# Patient Record
Sex: Female | Born: 1975 | Race: Black or African American | Hispanic: No | Marital: Single | Smoking: Never smoker
Health system: Southern US, Community
[De-identification: ages and names within clinical notes are randomized; demographics above are authoritative.]

---

## 2019-03-27 ENCOUNTER — Other Ambulatory Visit (HOSPITAL_COMMUNITY): Payer: Self-pay | Admitting: *Deleted

## 2019-03-27 DIAGNOSIS — N6310 Unspecified lump in the right breast, unspecified quadrant: Secondary | ICD-10-CM

## 2019-04-24 ENCOUNTER — Other Ambulatory Visit (HOSPITAL_COMMUNITY): Payer: Self-pay | Admitting: *Deleted

## 2019-04-24 ENCOUNTER — Encounter (HOSPITAL_COMMUNITY): Payer: Self-pay

## 2019-04-24 ENCOUNTER — Other Ambulatory Visit: Payer: Self-pay | Admitting: Obstetrics and Gynecology

## 2019-04-24 ENCOUNTER — Ambulatory Visit (HOSPITAL_COMMUNITY)
Admission: RE | Admit: 2019-04-24 | Discharge: 2019-04-24 | Disposition: A | Discharge status: Home / Self Care | Payer: Self-pay | Source: Ambulatory Visit | Attending: Obstetrics and Gynecology | Admitting: Obstetrics and Gynecology

## 2019-04-24 ENCOUNTER — Ambulatory Visit: Payer: Self-pay

## 2019-04-24 ENCOUNTER — Other Ambulatory Visit: Payer: Self-pay

## 2019-04-24 DIAGNOSIS — Z1231 Encounter for screening mammogram for malignant neoplasm of breast: Secondary | ICD-10-CM

## 2019-04-24 DIAGNOSIS — Z01419 Encounter for gynecological examination (general) (routine) without abnormal findings: Secondary | ICD-10-CM

## 2019-04-24 NOTE — Progress Notes (Signed)
Patient complained of a reddened bump on her right breast that went away around 3 weeks ago.  Pap Smear: Pap smear completed today. Last Pap smear was 04/13/2006 in [place] and normal per patient. Per patient has no history of an abnormal Pap smear. No Pap smear results are in Epic.  Physical exam: Breasts Breasts symmetrical. No skin abnormalities bilateral breasts. No nipple retraction bilateral breasts. No nipple discharge bilateral breasts. No lymphadenopathy. No lumps palpated bilateral breasts. No complaints of pain or tenderness on exam. Referred patient to the [facility] for a screening mammogram. Appointment scheduled for Thursday, April 26, 2019 at 0910.        Pelvic/Bimanual   Ext Genitalia No lesions, no swelling and no discharge observed on external genitalia.         Vagina Vagina pink and normal texture. No lesions or discharge observed in vagina.          Cervix Cervix is present. Cervix pink and of normal texture. White bumps observed on the left upper outer quadrant of cervix. No discharge observed.     Uterus Uterus is present and palpable. Uterus in normal position and normal size.        Adnexae Bilateral ovaries present and palpable. No tenderness on palpation.         Rectovaginal No rectal exam completed today since patient had no rectal complaints. No skin abnormalities observed on exam.    Smoking History: Patient has never smoked.  Patient Navigation: Patient education provided. Access to services provided for patient through [facility] program.   Breast and Cervical Cancer Risk Assessment: Patient has a family history of her mother having breast cancer. Patient has no known genetic mutations or history of radiation treatment to the chest before age 52. Patient has no history of cervical dysplasia, immunocompromised, or DES exposure in-utero.  Risk Assessment    Risk Scores      04/24/2019   Last edited by: Loletta Parish, RN    5-year risk: 1.1 %   Lifetime risk: 11.3 %

## 2019-04-24 NOTE — Patient Instructions (Signed)
Explained breast self awareness with Sanda Klein. Let patient know [facility] will cover Pap smears and HPV typing every 5 years unless has a history of abnormal Pap smears. Referred patient to the [facility] for a screening mammogram. Appointment scheduled for Thursday, April 26, 2019 at 0910. Patient aware of appointment and will be there. Let patient know will follow up with her within the next couple weeks with results of Pap smear by phone. Informed patient that the [facility] will follow-up with her within the next couple weeks with results of mammogram by letter or phone. Sanda Klein verbalized understanding.  Asencion Guisinger, Arvil Chaco, RN 1:38 PM

## 2019-04-26 ENCOUNTER — Other Ambulatory Visit: Payer: Self-pay

## 2019-04-26 ENCOUNTER — Ambulatory Visit
Admission: RE | Admit: 2019-04-26 | Discharge: 2019-04-26 | Disposition: A | Discharge status: Home / Self Care | Payer: Self-pay | Source: Ambulatory Visit | Attending: Obstetrics and Gynecology | Admitting: Obstetrics and Gynecology

## 2019-04-26 DIAGNOSIS — Z1231 Encounter for screening mammogram for malignant neoplasm of breast: Secondary | ICD-10-CM

## 2019-04-27 LAB — CYTOLOGY - PAP
Comment: NEGATIVE
Diagnosis: NEGATIVE
Diagnosis: REACTIVE
High risk HPV: NEGATIVE

## 2019-04-30 ENCOUNTER — Other Ambulatory Visit: Payer: Self-pay | Admitting: Obstetrics and Gynecology

## 2019-04-30 DIAGNOSIS — R928 Other abnormal and inconclusive findings on diagnostic imaging of breast: Secondary | ICD-10-CM

## 2019-05-02 ENCOUNTER — Other Ambulatory Visit: Payer: Self-pay | Admitting: Obstetrics and Gynecology

## 2019-05-02 ENCOUNTER — Other Ambulatory Visit: Payer: Self-pay

## 2019-05-02 ENCOUNTER — Ambulatory Visit
Admission: RE | Admit: 2019-05-02 | Discharge: 2019-05-02 | Disposition: A | Discharge status: Home / Self Care | Payer: No Typology Code available for payment source | Source: Ambulatory Visit | Attending: Obstetrics and Gynecology | Admitting: Obstetrics and Gynecology

## 2019-05-02 DIAGNOSIS — R928 Other abnormal and inconclusive findings on diagnostic imaging of breast: Secondary | ICD-10-CM

## 2019-05-02 DIAGNOSIS — N6310 Unspecified lump in the right breast, unspecified quadrant: Secondary | ICD-10-CM

## 2019-05-09 ENCOUNTER — Other Ambulatory Visit: Payer: Self-pay

## 2019-05-09 ENCOUNTER — Ambulatory Visit
Admission: RE | Admit: 2019-05-09 | Discharge: 2019-05-09 | Disposition: A | Discharge status: Home / Self Care | Payer: No Typology Code available for payment source | Source: Ambulatory Visit | Attending: Obstetrics and Gynecology | Admitting: Obstetrics and Gynecology

## 2019-05-09 ENCOUNTER — Other Ambulatory Visit: Payer: Self-pay | Admitting: Obstetrics and Gynecology

## 2019-05-09 DIAGNOSIS — N6001 Solitary cyst of right breast: Secondary | ICD-10-CM

## 2019-05-09 DIAGNOSIS — N6310 Unspecified lump in the right breast, unspecified quadrant: Secondary | ICD-10-CM

## 2019-05-25 ENCOUNTER — Telehealth (HOSPITAL_COMMUNITY): Payer: Self-pay | Admitting: *Deleted

## 2019-05-25 NOTE — Telephone Encounter (Signed)
Attempted to call patient to give Pap smear results. No one answered the phone. Left voicemail for patient to call me back. 

## 2019-05-25 NOTE — Telephone Encounter (Signed)
Patient returned my phone call. Let patient know that her Pap smear was normal and HPV negative. Informed patient that her next Pap smear is due in 5 years since she has no history of an abnormal Pap smear. Patient verbalized understanding.

## 2019-11-02 ENCOUNTER — Other Ambulatory Visit: Payer: Self-pay

## 2019-11-02 ENCOUNTER — Other Ambulatory Visit: Payer: Self-pay | Admitting: Obstetrics and Gynecology

## 2019-11-02 ENCOUNTER — Ambulatory Visit
Admission: RE | Admit: 2019-11-02 | Discharge: 2019-11-02 | Disposition: A | Discharge status: Home / Self Care | Payer: No Typology Code available for payment source | Source: Ambulatory Visit | Attending: Obstetrics and Gynecology | Admitting: Obstetrics and Gynecology

## 2019-11-02 DIAGNOSIS — N6310 Unspecified lump in the right breast, unspecified quadrant: Secondary | ICD-10-CM

## 2020-02-29 ENCOUNTER — Emergency Department (HOSPITAL_COMMUNITY)
Admission: EM | Admit: 2020-02-29 | Discharge: 2020-03-01 | Disposition: A | Discharge status: Home / Self Care | Payer: No Typology Code available for payment source | Attending: Emergency Medicine | Admitting: Emergency Medicine

## 2020-02-29 ENCOUNTER — Other Ambulatory Visit: Payer: Self-pay

## 2020-02-29 DIAGNOSIS — Z20822 Contact with and (suspected) exposure to covid-19: Secondary | ICD-10-CM | POA: Insufficient documentation

## 2020-02-29 DIAGNOSIS — R630 Anorexia: Secondary | ICD-10-CM | POA: Insufficient documentation

## 2020-02-29 DIAGNOSIS — R197 Diarrhea, unspecified: Secondary | ICD-10-CM | POA: Insufficient documentation

## 2020-02-29 DIAGNOSIS — R350 Frequency of micturition: Secondary | ICD-10-CM | POA: Insufficient documentation

## 2020-02-29 DIAGNOSIS — J019 Acute sinusitis, unspecified: Secondary | ICD-10-CM | POA: Insufficient documentation

## 2020-02-29 DIAGNOSIS — R000 Tachycardia, unspecified: Secondary | ICD-10-CM | POA: Insufficient documentation

## 2020-02-29 DIAGNOSIS — R3915 Urgency of urination: Secondary | ICD-10-CM | POA: Insufficient documentation

## 2020-02-29 DIAGNOSIS — R930 Abnormal findings on diagnostic imaging of skull and head, not elsewhere classified: Secondary | ICD-10-CM | POA: Insufficient documentation

## 2020-02-29 MED ORDER — ACETAMINOPHEN 500 MG PO TABS
1000.0000 mg | ORAL_TABLET | Freq: Once | ORAL | Status: AC
  Administered 2020-02-29: 1000 mg via ORAL
  Filled 2020-02-29: qty 2

## 2020-02-29 NOTE — ED Notes (Signed)
Pt called 3 times no response  

## 2020-02-29 NOTE — ED Triage Notes (Signed)
Pt arrives to ED w/ generalized weakness, body aches, and fever. Pt states she tested positive for covid on 8/28.

## 2020-03-01 ENCOUNTER — Emergency Department (HOSPITAL_COMMUNITY): Payer: No Typology Code available for payment source

## 2020-03-01 ENCOUNTER — Encounter (HOSPITAL_COMMUNITY): Payer: Self-pay | Admitting: Student

## 2020-03-01 LAB — COMPREHENSIVE METABOLIC PANEL
ALT: 45 U/L — ABNORMAL HIGH (ref 0–44)
AST: 43 U/L — ABNORMAL HIGH (ref 15–41)
Albumin: 3 g/dL — ABNORMAL LOW (ref 3.5–5.0)
Alkaline Phosphatase: 51 U/L (ref 38–126)
Anion gap: 11 (ref 5–15)
BUN: 17 mg/dL (ref 6–20)
CO2: 22 mmol/L (ref 22–32)
Calcium: 8.3 mg/dL — ABNORMAL LOW (ref 8.9–10.3)
Chloride: 99 mmol/L (ref 98–111)
Creatinine, Ser: 1.09 mg/dL — ABNORMAL HIGH (ref 0.44–1.00)
GFR calc Af Amer: >60 mL/min (ref >60)
GFR calc non Af Amer: >60 mL/min (ref >60)
Glucose, Bld: 124 mg/dL — ABNORMAL HIGH (ref 70–99)
Potassium: 3.1 mmol/L — ABNORMAL LOW (ref 3.5–5.1)
Sodium: 132 mmol/L — ABNORMAL LOW (ref 135–145)
Total Bilirubin: 2 mg/dL — ABNORMAL HIGH (ref 0.3–1.2)
Total Protein: 7 g/dL (ref 6.5–8.1)

## 2020-03-01 LAB — URINALYSIS, ROUTINE W REFLEX MICROSCOPIC
Bilirubin Urine: NEGATIVE
Glucose, UA: NEGATIVE mg/dL
Ketones, ur: 5 mg/dL — AB
Leukocytes,Ua: NEGATIVE
Nitrite: NEGATIVE
Protein, ur: 100 mg/dL — AB
Specific Gravity, Urine: 1.03 (ref 1.005–1.030)
pH: 5 (ref 5.0–8.0)

## 2020-03-01 LAB — CBC WITH DIFFERENTIAL/PLATELET
Abs Immature Granulocytes: 0.05 10*3/uL (ref 0.00–0.07)
Basophils Absolute: 0 10*3/uL (ref 0.0–0.1)
Basophils Relative: 0 %
Eosinophils Absolute: 0 10*3/uL (ref 0.0–0.5)
Eosinophils Relative: 0 %
HCT: 35.6 % — ABNORMAL LOW (ref 36.0–46.0)
Hemoglobin: 11.6 g/dL — ABNORMAL LOW (ref 12.0–15.0)
Immature Granulocytes: 1 %
Lymphocytes Relative: 19 %
Lymphs Abs: 0.9 10*3/uL (ref 0.7–4.0)
MCH: 27 pg (ref 26.0–34.0)
MCHC: 32.6 g/dL (ref 30.0–36.0)
MCV: 83 fL (ref 80.0–100.0)
Monocytes Absolute: 0.4 10*3/uL (ref 0.1–1.0)
Monocytes Relative: 8 %
Neutro Abs: 3.3 10*3/uL (ref 1.7–7.7)
Neutrophils Relative %: 72 %
Platelets: 77 10*3/uL — ABNORMAL LOW (ref 150–400)
RBC: 4.29 MIL/uL (ref 3.87–5.11)
RDW: 14.6 % (ref 11.5–15.5)
WBC: 4.6 10*3/uL (ref 4.0–10.5)
nRBC: 0 % (ref 0.0–0.2)

## 2020-03-01 LAB — RESP PANEL BY RT PCR (RSV, FLU A&B, COVID)
Influenza A by PCR: NEGATIVE
Influenza B by PCR: NEGATIVE
Respiratory Syncytial Virus by PCR: NEGATIVE
SARS Coronavirus 2 by RT PCR: NEGATIVE

## 2020-03-01 LAB — PROTIME-INR
INR: 1.3 — ABNORMAL HIGH (ref 0.8–1.2)
Prothrombin Time: 15.6 seconds — ABNORMAL HIGH (ref 11.4–15.2)

## 2020-03-01 LAB — I-STAT BETA HCG BLOOD, ED (MC, WL, AP ONLY): I-stat hCG, quantitative: <5 m[IU]/mL (ref <5)

## 2020-03-01 LAB — LACTIC ACID, PLASMA: Lactic Acid, Venous: 1.8 mmol/L (ref 0.5–1.9)

## 2020-03-01 LAB — APTT: aPTT: 39 seconds — ABNORMAL HIGH (ref 24–36)

## 2020-03-01 MED ORDER — POTASSIUM CHLORIDE CRYS ER 20 MEQ PO TBCR
40.0000 meq | EXTENDED_RELEASE_TABLET | Freq: Once | ORAL | Status: AC
  Administered 2020-03-01: 40 meq via ORAL
  Filled 2020-03-01: qty 2

## 2020-03-01 MED ORDER — SODIUM CHLORIDE 0.9 % IV BOLUS
500.0000 mL | Freq: Once | INTRAVENOUS | Status: AC
  Administered 2020-03-01: 500 mL via INTRAVENOUS

## 2020-03-01 MED ORDER — POTASSIUM CHLORIDE CRYS ER 20 MEQ PO TBCR: 20.0000 meq | EXTENDED_RELEASE_TABLET | Freq: Every day | ORAL | 0 refills | Status: AC

## 2020-03-01 MED ORDER — ACETAMINOPHEN ER 650 MG PO TBCR: 650.0000 mg | EXTENDED_RELEASE_TABLET | Freq: Three times a day (TID) | ORAL | 0 refills | Status: AC | PRN

## 2020-03-01 MED ORDER — FLUTICASONE PROPIONATE 50 MCG/ACT NA SUSP: 1.0000 | Freq: Every day | NASAL | 0 refills | Status: AC | PRN

## 2020-03-01 MED ORDER — AMOXICILLIN-POT CLAVULANATE 875-125 MG PO TABS: 1.0000 | ORAL_TABLET | Freq: Two times a day (BID) | ORAL | 0 refills | Status: AC

## 2020-03-01 MED ORDER — ACETAMINOPHEN 500 MG PO TABS
1000.0000 mg | ORAL_TABLET | Freq: Once | ORAL | Status: AC
  Administered 2020-03-01: 1000 mg via ORAL
  Filled 2020-03-01: qty 2

## 2020-03-01 NOTE — ED Provider Notes (Signed)
[facility]   CSN: [id number] Arrival date & time: 02/29/20  1528     History Chief Complaint  Patient presents with  . Fever    Katie Haynes is a 44 y.o. female without significant past medical history who presents to the emergency department with complaints of fever over the past 1 week.  Patient states that she became ill 02/08/20, diagnosed with COVID 19 08/28, had sxs of N/V/D with fever, chills, and cough at that time that lasted about 1.5 weeks, she then had about 1 week where she felt improved, and subsequently started to feel poorly again 1 week ago.  She states that she has had significant nasal congestion, sinus pain/pressure, dry cough, poor appetite/p.o. intake, fevers, and chills.  She has noted some diarrhea and urinary frequency/urgency as well on further questioning.  She had acupuncture done seem to alleviate some of her sinus discomfort, otherwise no significant alleviating or aggravating factors.  Has been taking Mucinex without much change.  She denies any current chest pain, dyspnea, syncope, nausea, vomiting, or abdominal pain.  HPI     No past medical history on file.  Patient Active Problem List   Diagnosis Date Noted  . Well woman exam with routine gynecological exam 04/24/2019    No past surgical history on file.   OB History    Gravida  3   Para      Term      Preterm      AB  2   Living  0     SAB  1   TAB      Ectopic      Multiple      Live Births              Family History  Problem Relation Age of Onset  . Breast cancer Mother   . Osteoarthritis Mother   . Colon cancer Paternal Grandfather     Social History   Tobacco Use  . Smoking status: Never Smoker  . Smokeless tobacco: Never Used  Vaping Use  . Vaping Use: Never used  Substance Use Topics  . Alcohol use: Yes    Comment: occassionally  . Drug use: Not Currently    Home Medications Prior to Admission  medications   Not on File    Allergies    Patient has no allergy information on record.  Review of Systems   Review of Systems  Constitutional: Positive for appetite change, chills, fatigue and fever.  HENT: Positive for congestion, sinus pressure and sinus pain. Negative for sore throat.   Respiratory: Positive for cough. Negative for shortness of breath.   Cardiovascular: Negative for chest pain and leg swelling.  Gastrointestinal: Positive for diarrhea. Negative for abdominal pain, nausea and vomiting.  Genitourinary: Positive for frequency and urgency. Negative for dysuria.  Neurological: Negative for syncope.  All other systems reviewed and are negative.   Physical Exam Updated Vital Signs BP 120/72 (BP Location: Right Arm)   Pulse (!) 133   Temp (!) 103.1 F (39.5 C) (Oral)   Resp (!) 26   SpO2 99%   Physical Exam Vitals and nursing note reviewed.  Constitutional:      General: She is not in acute distress.    Appearance: She is well-developed.  HENT:     Head: Normocephalic and atraumatic.     Right Ear: Ear canal normal. Tympanic membrane is not perforated, erythematous, retracted or bulging.  Left Ear: Ear canal normal. Tympanic membrane is not perforated, erythematous, retracted or bulging.     Ears:     Comments: No mastoid erythema/swelling/tenderness.     Nose: Congestion present.     Comments: Bilateral maxillary sinus tenderness to palpation.    Mouth/Throat:     Mouth: Mucous membranes are dry.     Pharynx: Uvula midline. No oropharyngeal exudate or posterior oropharyngeal erythema.     Comments: Posterior oropharynx is symmetric appearing. Patient tolerating own secretions without difficulty. No trismus. No drooling. No hot potato voice. No swelling beneath the tongue, submandibular compartment is soft.  Eyes:     General:        Right eye: No discharge.        Left eye: No discharge.     Conjunctiva/sclera: Conjunctivae normal.     Pupils: Pupils  are equal, round, and reactive to light.  Cardiovascular:     Rate and Rhythm: Regular rhythm. Tachycardia present.     Heart sounds: No murmur heard.   Pulmonary:     Effort: Pulmonary effort is normal. No respiratory distress.     Breath sounds: No wheezing or rales.  Abdominal:     General: There is no distension.     Palpations: Abdomen is soft.     Tenderness: There is no abdominal tenderness.  Musculoskeletal:     Cervical back: Normal range of motion and neck supple. No edema or rigidity.  Lymphadenopathy:     Cervical: No cervical adenopathy.  Skin:    General: Skin is warm and dry.     Findings: No rash.  Neurological:     Mental Status: She is alert.  Psychiatric:        Behavior: Behavior normal.     ED Results / Procedures / Treatments   Labs (all labs ordered are listed, but only abnormal results are displayed) Labs Reviewed  COMPREHENSIVE METABOLIC PANEL - Abnormal; Notable for the following components:      Result Value   Sodium 132 (*)    Potassium 3.1 (*)    Glucose, Bld 124 (*)    Creatinine, Ser 1.09 (*)    Calcium 8.3 (*)    Albumin 3.0 (*)    AST 43 (*)    ALT 45 (*)    Total Bilirubin 2.0 (*)    All other components within normal limits  CBC WITH DIFFERENTIAL/PLATELET - Abnormal; Notable for the following components:   Hemoglobin 11.6 (*)    HCT 35.6 (*)    Platelets 77 (*)    All other components within normal limits  PROTIME-INR - Abnormal; Notable for the following components:   Prothrombin Time 15.6 (*)    INR 1.3 (*)    All other components within normal limits  APTT - Abnormal; Notable for the following components:   aPTT 39 (*)    All other components within normal limits  URINALYSIS, ROUTINE W REFLEX MICROSCOPIC - Abnormal; Notable for the following components:   Color, Urine AMBER (*)    APPearance HAZY (*)    Hgb urine dipstick SMALL (*)    Ketones, ur 5 (*)    Protein, ur 100 (*)    Bacteria, UA FEW (*)    All other  components within normal limits  RESP PANEL BY RT PCR (RSV, FLU A&B, COVID)  URINE CULTURE  CULTURE, BLOOD (ROUTINE X 2)  CULTURE, BLOOD (ROUTINE X 2)  LACTIC ACID, PLASMA  I-STAT BETA HCG BLOOD, ED ([facility],  [facility], [facility] ONLY)    EKG None  Radiology DG Chest Port 1 View  Result Date: 03/01/2020 CLINICAL DATA:  Sepsis EXAM: PORTABLE CHEST 1 VIEW COMPARISON:  None. FINDINGS: The heart size and mediastinal contours are within normal limits. Both lungs are clear. The visualized skeletal structures are unremarkable. IMPRESSION: No active disease. Electronically Signed   By: Helyn Numbers MD   On: 03/01/2020 01:37    Procedures Procedures (including critical care time)  Medications Ordered in ED Medications  acetaminophen (TYLENOL) tablet 1,000 mg (1,000 mg Oral Given 02/29/20 1951)    ED Course  I have reviewed the triage vital signs and the nursing notes.  Pertinent labs & imaging results that were available during my care of the patient were reviewed by me and considered in my medical decision making (see chart for details).  Clinical Course as of Mar 01 717  Sat Mar 01, 2020  9937 Fever, sinus pain, dry cough 1 week after recovering from COVID illness/symptoms. No Cp, SOB. Reports some urinary frequency but UA not concerning, awaiting urine. Will be dc with augmentin.   Plan to await CT for sinusitis complication. Anticipate dc with augmentin, pcp fu.    [CG]    Clinical Course User Index [CG] Liberty Handy, PA-C   MDM Rules/Calculators/A&P                          Patient presents to the ED with complaints of fever for the past 1 week.  On arrival she is nontoxic, notably febrile with likely resultant tachycardia.  Exam with nasal congestion and bilateral maxillary sinus tenderness to palpation.  Otherwise fairly benign.  Additional history obtained:  Additional history obtained from chart review nursing note reviewed.  Lab Tests:  I Ordered, reviewed, and interpreted  labs, which included:  CBC: No significant leukocytosis, mild anemia/thrombocytopenia, will need PCP recheck. CMP: Mild electrolyte abnormalities as above, normal saline fluids given and oral potassium ordered.  Mildly elevated creatinine, no prior record for comparison.  Mild transaminitis, potentially with recent Covid. PT/INR/APTT: WNL Urinalysis: No significant UTI.   Respiratory virus panel unremarkable  Imaging Studies ordered:  I ordered imaging studies which included CXR, I independently visualized and interpreted imaging which showed no acute process.   Patient > 14 days from covid 19 diagnosis and had period of symptomatic improvement therefore doubt her current presentation is COVID-19, more concern for superimposed bacterial process.  She has no nuchal rigidity.  No signs of AOM, AOE, or mastoiditis.  Oropharynx is clear without signs of strep or RPA/PTA.  Chest x-ray without focal infiltrate therefore doubt pneumonia.  No complaints of chest pain or dyspnea to raise concern for PE or significant cardiac process. Abdomen is nontender without peritoneal signs.  Her urinalysis does not show findings of UTI.  Blood cultures pending. Concern for acute bacterial sinusitis- CT head ordered to ensure no obvious complicating features such as abscess/deeper space process. CT pending @ change of shift.   07:15: Patient care signed out to Sharen Heck PA-C at change of shift pending CT head. If no acute process on CT plan for discharge home with augmentin & additional symptomatic care.   Findings and plan of care discussed with supervising physician Dr. Blinda Leatherwood who is in agreement.   Portions of this note were generated with Scientist, clinical (histocompatibility and immunogenetics). Dictation errors may occur despite best attempts at proofreading.  Final Clinical Impression(s) / ED Diagnoses Final diagnoses:  Acute  bacterial sinusitis    Rx / DC Orders ED Discharge Orders         Ordered    amoxicillin-clavulanate  (AUGMENTIN) 875-125 MG tablet  Every 12 hours        03/01/20 0712    fluticasone (FLONASE) 50 MCG/ACT nasal spray  Daily PRN        03/01/20 0712    potassium chloride SA (KLOR-CON) 20 MEQ tablet  Daily        03/01/20 0712    acetaminophen (TYLENOL 8 HOUR) 650 MG CR tablet  Every 8 hours PRN        03/01/20 0712           Cherly Anderson, PA-C 03/01/20 6734    Gilda Crease, MD 03/01/20 [phone]

## 2020-03-01 NOTE — Discharge Instructions (Addendum)
You're seen in the emergency department today for a fever.  Your chest x-ray was normal.  Your labs show that your electrolytes were a bit low, we're sending you home with potassium to take daily for the next few days as well as some diet recommendations.  You received some fluids to help with your sodium and your chloride.  Your kidney function as well as your liver function tests were also mildly elevated and your hemoglobin/hematocrit and your platelets (these are blood counts) were somewhat low, please have these rechecked by your primary care provider within 1 week.   Your chest x-ray did not show findings of pneumonia.  Your Covid, flu, and RSV test are negative.   Based on your symptoms we are treating you for bacterial sinusitis.  CT showed evidence of acute sinusitis but not complications in the head/brain from this. Please take Augmentin twice per day for the next 1 week.  We are sending you with Flonase to use 1 spray per nostril daily help with congestion as well as Tylenol to take every 8 hours as needed for fever/pain.  We have prescribed you new medication(s) today. Discuss the medications prescribed today with your pharmacist as they can have adverse effects and interactions with your other medicines including over the counter and prescribed medications. Seek medical evaluation if you start to experience new or abnormal symptoms after taking one of these medicines, seek care immediately if you start to experience difficulty breathing, feeling of your throat closing, facial swelling, or rash as these could be indications of a more serious allergic reaction  We would like you to follow-up closely with your primary care provider within 3 days.  Return to the ER for new or worsening symptoms including but not limited to persistent fever after 48 hours of antibiotics, increased pain, neck stiffness, passing out, trouble breathing, chest pain, abdominal pain, inability to keep fluids down, or any  other concerns.

## 2020-03-01 NOTE — ED Notes (Signed)
Pt nervous and jittery

## 2020-03-01 NOTE — ED Provider Notes (Signed)
  Physical Exam  BP 97/73   Pulse (!) 112   Temp 98.2 F (36.8 C)   Resp (!) 24   Ht 5\' 6"  (1.676 m)   Wt 101.5 kg   SpO2 97%   BMI 36.12 kg/m   ED Course/Procedures   Clinical Course as of Mar 01 840  Sat Mar 01, 2020  0714 Fever, sinus pain, dry cough 1 week after recovering from COVID illness/symptoms. No Cp, SOB. Reports some urinary frequency but UA not concerning, awaiting urine. Will be dc with augmentin.   Plan to await CT for sinusitis complication. Anticipate dc with augmentin, pcp fu.    [CG]  0840 1. No acute intracranial abnormality. 2. Trace amount of frothy fluid within bilateral sphenoid sinuses. This could reflect acute sinusitis in the appropriate clinical setting.   CT Head Wo Contrast [CG]    Clinical Course User Index [CG] 0715, PA-C    Procedures  MDM   [phone]: Patient signed out to me by previous ED PA shift change.  See previous note for full details.  Plan at discharge is to await for CT to rule out sinusitis complication, anticipate discharge with Augmentin and PCP follow-up.  Patient arrived tachycardic and febrile, extensive work-up in the ER overall reassuring.  Tachycardia and fever have improved.  No chest pain, shortness of breath, cough.  Had Covid 1 week ago and resolved.  CT head confirms sinusitis but no other complications.  Patient appropriate for discharge with Augmentin, close PCP follow-up.  Urinalysis not convincing for infection, urine culture pending.      7253, PA-C 03/01/20 0842    03/03/20, DO 03/01/20 [phone]

## 2020-03-01 NOTE — ED Notes (Signed)
Sick with temp since the last of august   Elevated temp at night  Down duting the daytime hours

## 2020-03-01 NOTE — ED Notes (Signed)
Patient verbalizes understanding of discharge instructions. Opportunity for questioning and answers were provided. Arm band removed by staff, patient discharged from ED. 

## 2020-03-02 LAB — URINE CULTURE

## 2020-03-06 LAB — CULTURE, BLOOD (ROUTINE X 2)
Culture: NO GROWTH
Culture: NO GROWTH
Special Requests: ADEQUATE
Special Requests: ADEQUATE

## 2020-04-02 ENCOUNTER — Other Ambulatory Visit: Payer: No Typology Code available for payment source

## 2020-04-02 DIAGNOSIS — Z20822 Contact with and (suspected) exposure to covid-19: Secondary | ICD-10-CM

## 2020-04-03 ENCOUNTER — Other Ambulatory Visit: Payer: No Typology Code available for payment source

## 2020-04-03 DIAGNOSIS — Z20822 Contact with and (suspected) exposure to covid-19: Secondary | ICD-10-CM

## 2020-04-04 LAB — NOVEL CORONAVIRUS, NAA
SARS-CoV-2, NAA: NOT DETECTED
SARS-CoV-2, NAA: NOT DETECTED

## 2020-04-04 LAB — SARS-COV-2, NAA 2 DAY TAT

## 2020-04-09 ENCOUNTER — Other Ambulatory Visit: Payer: No Typology Code available for payment source

## 2020-04-09 DIAGNOSIS — Z20822 Contact with and (suspected) exposure to covid-19: Secondary | ICD-10-CM

## 2020-04-10 LAB — NOVEL CORONAVIRUS, NAA: SARS-CoV-2, NAA: NOT DETECTED

## 2020-04-10 LAB — SARS-COV-2, NAA 2 DAY TAT

## 2021-12-09 IMAGING — US US BREAST*R* LIMITED INC AXILLA
1 series · 12 of 12 positions shown · non-contrast
Comparison: Previous exam(s).

CLINICAL DATA: 43-year-old female for six-month follow-up of
probable LEFT breast cystic changes and intermittent discomfort in
the UPPER-OUTER RIGHT breast/axilla.

EXAM:
DIGITAL DIAGNOSTIC RIGHT MAMMOGRAM WITH CAD AND TOMO
ULTRASOUND BILATERAL BREAST

[Series 1: us breast*right* limited inc axilla · 0.07mm/px · 12 of 12 slices shown]
[im 1/12]
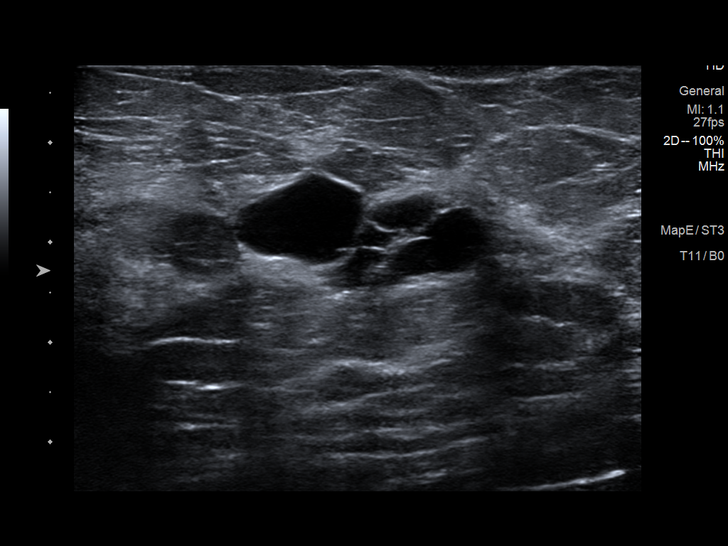
[im 2/12]
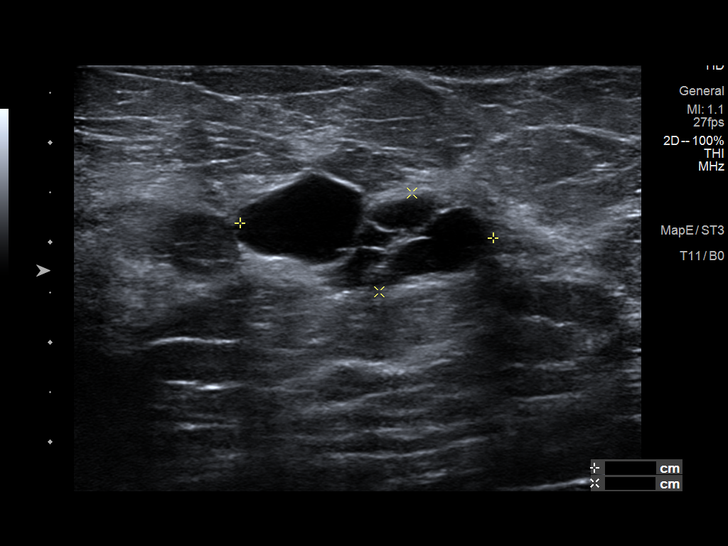
[im 3/12]
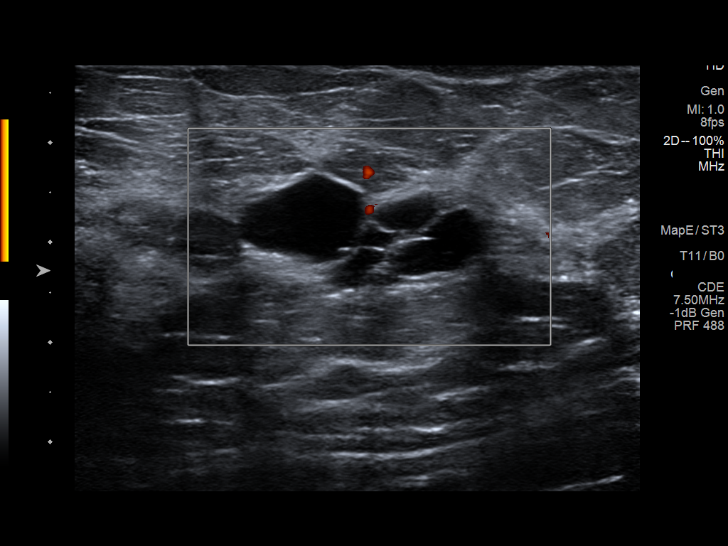
[im 4/12]
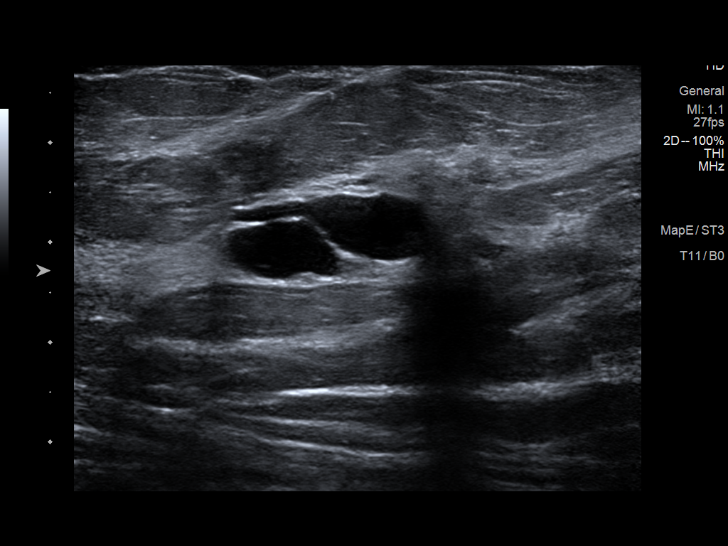
[im 5/12]
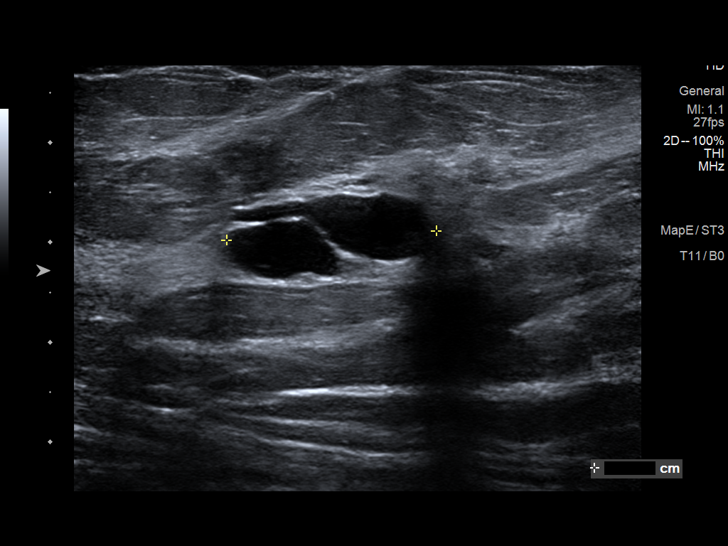
[im 6/12]
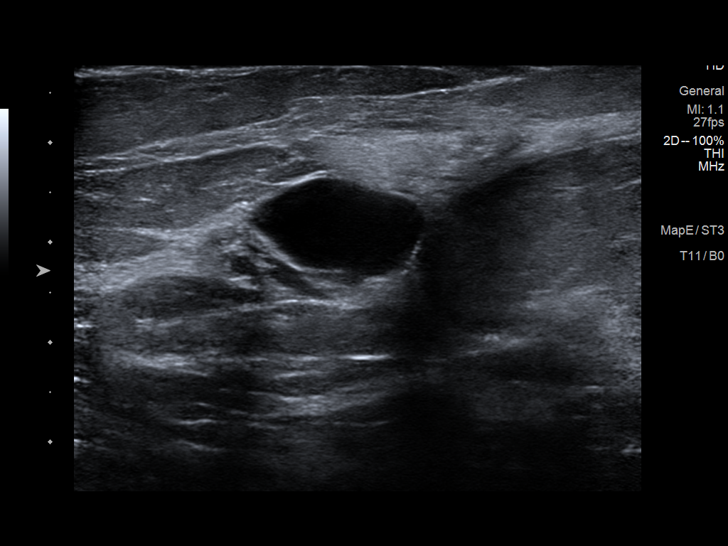
[im 7/12]
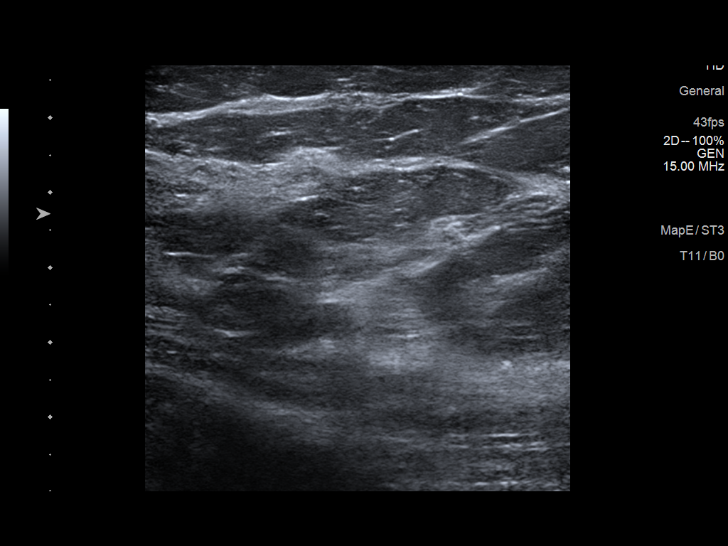
[im 8/12]
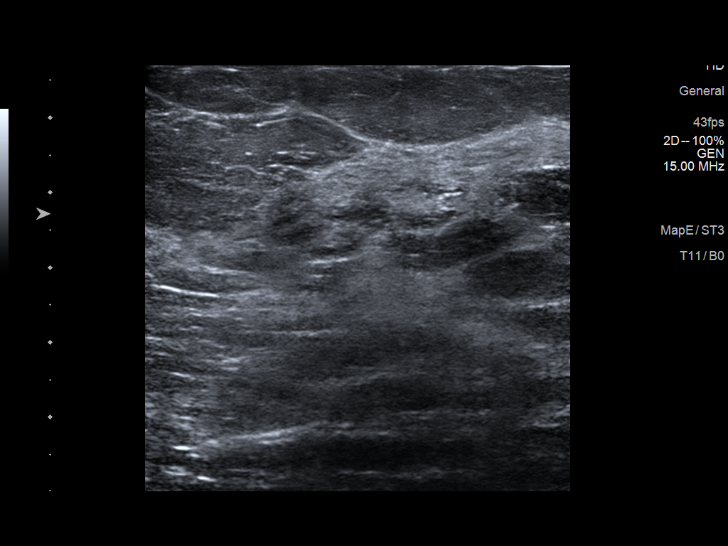
[im 9/12]
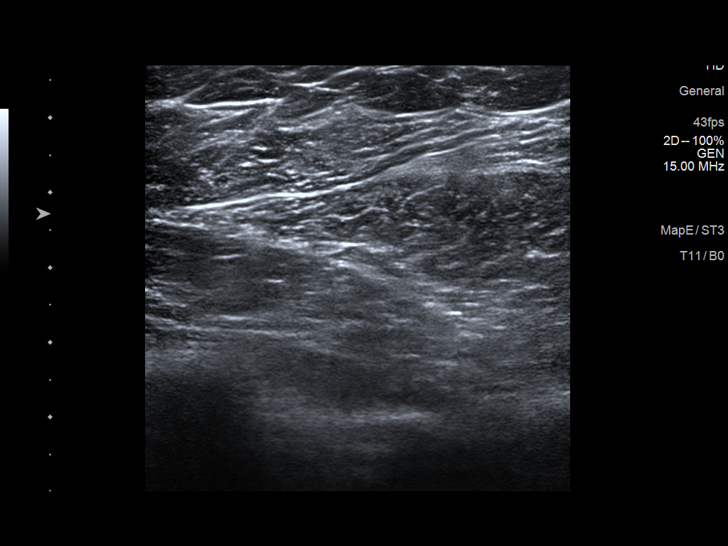
[im 10/12]
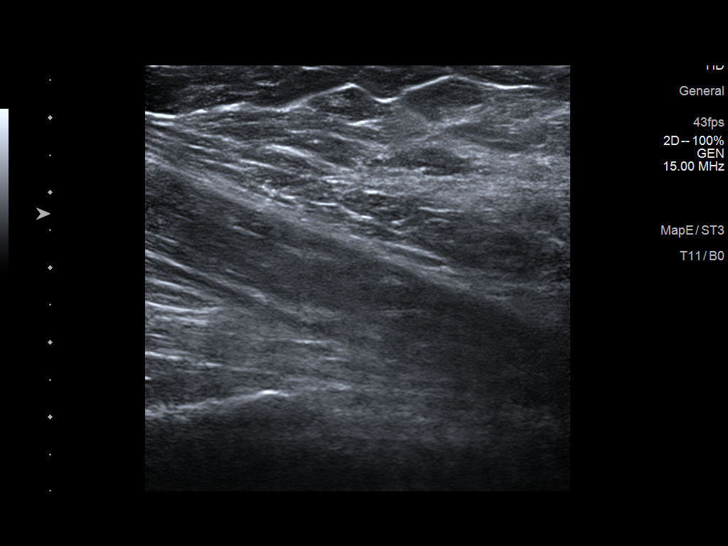
[im 11/12]
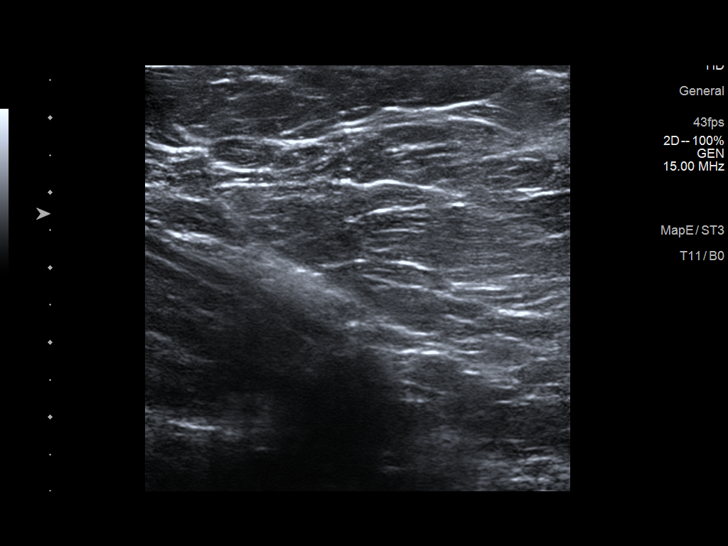
[im 12/12]
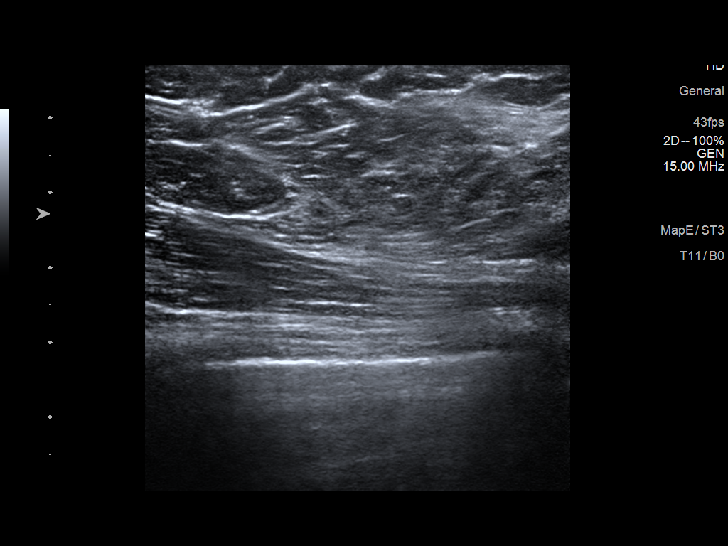

[12 of 12 positions shown; findings below may reference images not displayed]

ACR Breast Density Category b: There are scattered areas of
fibroglandular density.
FINDINGS: 2D/3D full field views of the RIGHT breast and a spot compression
view of the RIGHT axilla demonstrate no suspicious mass, distortion
or worrisome calcifications.

Mammographic images were processed with CAD.

Targeted ultrasound is performed, showing the following:

RIGHT breast: A 2.5 x 1 x 2.1 cm cluster of benign cysts at the [DATE]
position 14 cm from the nipple. No abnormalities are identified in
the RIGHT axilla.

LEFT breast: A stable 0.4 x 0.3 x 0.8 cm complicated cyst at the 9
o'clock position of the LEFT breast 5 cm from the nipple.
IMPRESSION: 1. Benign complicated cysts within both OUTER breasts.
2. No suspicious findings within the UPPER-OUTER RIGHT breast or
RIGHT axilla, in the area of patient's discomfort.

RECOMMENDATION:
Bilateral screening mammogram in 6 months to resume annual mammogram
schedule.

I have discussed the findings, causes of breast pain and
recommendations with the patient. If applicable, a reminder letter
will be sent to the patient regarding the next appointment.

BI-RADS CATEGORY  2: Benign.

## 2022-11-17 ENCOUNTER — Other Ambulatory Visit: Payer: Self-pay

## 2022-11-17 ENCOUNTER — Other Ambulatory Visit: Payer: Self-pay | Admitting: Obstetrics and Gynecology

## 2022-11-17 DIAGNOSIS — Z1231 Encounter for screening mammogram for malignant neoplasm of breast: Secondary | ICD-10-CM

## 2022-11-29 ENCOUNTER — Other Ambulatory Visit: Payer: Self-pay

## 2022-11-29 ENCOUNTER — Ambulatory Visit
Admission: RE | Admit: 2022-11-29 | Discharge: 2022-11-29 | Disposition: A | Discharge status: Home / Self Care | Payer: Self-pay | Source: Ambulatory Visit | Attending: Obstetrics and Gynecology | Admitting: Obstetrics and Gynecology

## 2022-11-29 ENCOUNTER — Ambulatory Visit: Payer: Self-pay | Attending: Hematology and Oncology | Admitting: Hematology and Oncology

## 2022-11-29 VITALS — BP 142/92 | Wt 249.6 lb

## 2022-11-29 DIAGNOSIS — Z1211 Encounter for screening for malignant neoplasm of colon: Secondary | ICD-10-CM

## 2022-11-29 DIAGNOSIS — Z1231 Encounter for screening mammogram for malignant neoplasm of breast: Secondary | ICD-10-CM

## 2022-11-29 NOTE — Patient Instructions (Signed)
Taught Katie Haynes about self breast awareness and gave educational materials to take home. Patient did not need a Pap smear today due to last Pap smear was in 04/24/2019 per patient.  Let her know [facility] will cover Pap smears every 5 years unless has a history of abnormal Pap smears. Referred [facility] to [facility] for screening mammogram. Appointment scheduled for 11/29/22. Patient aware of appointment and will be there. Let patient know will follow up with her within the next couple weeks with results. Katie Haynes verbalized understanding.  Pascal Lux, NP 2:42 PM

## 2022-11-29 NOTE — Progress Notes (Signed)
[name] is a 47 y.o. female who presents to [facility] clinic today with no complaints.    Pap Smear: Pap not smear completed today. Last Pap smear was 04/24/2019 at [facility] and was normal. Per patient has no history of an abnormal Pap smear. Last Pap smear result is available in Epic. She is on her period today and request appointment in a couple of weeks for repeat appointment for Pap smear.    Physical exam: Breasts Breasts symmetrical. No skin abnormalities bilateral breasts. No nipple retraction bilateral breasts. No nipple discharge bilateral breasts. No lymphadenopathy. No lumps palpated bilateral breasts.  MS DIGITAL DIAG TOMO UNI RIGHT  Result Date: 11/02/2019 CLINICAL DATA:  47 year old female for six-month follow-up of probable LEFT breast cystic changes and intermittent discomfort in the UPPER-OUTER RIGHT breast/axilla. EXAM: DIGITAL DIAGNOSTIC RIGHT MAMMOGRAM WITH CAD AND TOMO ULTRASOUND BILATERAL BREAST COMPARISON:  Previous exam(s). ACR Breast Density Category b: There are scattered areas of fibroglandular density. FINDINGS: 2D/3D full field views of the RIGHT breast and a spot compression view of the RIGHT axilla demonstrate no suspicious mass, distortion or worrisome calcifications. Mammographic images were processed with CAD. Targeted ultrasound is performed, showing the following: RIGHT breast: A 2.5 x 1 x 2.1 cm cluster of benign cysts at the 9:30 position 14 cm from the nipple. No abnormalities are identified in the RIGHT axilla. LEFT breast: A stable 0.4 x 0.3 x 0.8 cm complicated cyst at the 9 o'clock position of the LEFT breast 5 cm from the nipple. IMPRESSION: 1. Benign complicated cysts within both OUTER breasts. 2. No suspicious findings within the UPPER-OUTER RIGHT breast or RIGHT axilla, in the area of patient's discomfort. RECOMMENDATION: Bilateral screening mammogram in 6 months to resume annual mammogram schedule. I have discussed the findings, causes of breast pain  and recommendations with the patient. If applicable, a reminder letter will be sent to the patient regarding the next appointment. BI-RADS CATEGORY  2: Benign. Electronically Signed   By: Harmon Pier M.D.   On: 11/02/2019 09:32   MS DIGITAL DIAG TOMO BILAT  Result Date: 05/02/2019 CLINICAL DATA:  Patient returns today to evaluate possible masses in the RIGHT breast identified on a recent baseline screening mammogram. Patient also returns to evaluate a possible mass and a possible asymmetry within the LEFT breast, also identified on the recent baseline screening mammogram EXAM: DIGITAL DIAGNOSTIC BILATERAL MAMMOGRAM WITH CAD AND TOMO ULTRASOUND BILATERAL BREAST COMPARISON:  Baseline screening mammogram dated 04/26/2019. ACR Breast Density Category b: There are scattered areas of fibroglandular density. FINDINGS: RIGHT breast: An oval circumscribed mass is confirmed within the inner RIGHT breast, measuring approximately 10 mm greatest dimension. RIGHT axilla was evaluated with ultrasound showing no enlarged or morphologically abnormal lymph nodes. LEFT breast: There are 2 adjacent partially obscured mass is confirmed within the inner LEFT breast, each measuring approximately 1 cm greatest dimension. Mammographic images were processed with CAD. RIGHT breast: Targeted ultrasound is performed, showing a mixed cystic and solid mass within the RIGHT breast at the 3:30 o'clock axis, 2 cm from the nipple, measuring 10 x 7 x 9 mm, without internal vascularity, corresponding to mammographic finding. LEFT breast: Targeted ultrasound is performed, showing a lobular hypoechoic mass within the LEFT breast at the 9 o'clock axis, 5 cm from the nipple, measuring 9 x 4 x 4 mm, most suggestive of a benign cluster of cysts. No suspicious solid or cystic mass is identified within the inner LEFT breast by ultrasound. IMPRESSION: 1. Mixed cystic  and solid mass within the RIGHT breast at the 3:30 o'clock axis, 2 cm from nipple,  measuring 10 mm, corresponding to the mammographic finding. Ultrasound-guided biopsy is recommended. 2. Probably benign cluster of cysts within the LEFT breast at the 9 o'clock axis, 5 cm from the nipple, corresponding to the mammographic findings. RECOMMENDATION: 1. Ultrasound-guided biopsy for the mixed cystic and solid mass within the RIGHT breast at the 3:30 o'clock axis. 2. If ultrasound-guided biopsy reveals a benign pathology result, then recommend follow-up LEFT breast diagnostic mammogram and ultrasound in 6 months for the probably benign cluster of cysts. Ultrasound-guided biopsy will be scheduled at patient's earliest convenience. I have discussed the findings and recommendations with the patient. If applicable, a reminder letter will be sent to the patient regarding the next appointment. BI-RADS CATEGORY  4: Suspicious. Electronically Signed   By: Bary Richard M.D.   On: 05/02/2019 10:14   MS DIGITAL SCREENING TOMO BILATERAL  Result Date: 04/27/2019 CLINICAL DATA:  Screening. EXAM: DIGITAL SCREENING BILATERAL MAMMOGRAM WITH TOMO AND CAD COMPARISON:  NONE. ACR Breast Density Category c: The breast tissue is heterogeneously dense, which may obscure small masses. FINDINGS: In the right breast masses requires further evaluation. In the left breast asymmetry and possible mass requires further evaluation. Images were processed with CAD. IMPRESSION: Further evaluation is suggested for possible masses in the right breast. Further evaluation is suggested for possible asymmetry and possible mass in the left breast. RECOMMENDATION: Diagnostic mammogram and possibly ultrasound of both breasts. (Code:[id number]) The patient will be contacted regarding the findings, and additional imaging will be scheduled. BI-RADS CATEGORY  0: Incomplete. Need additional imaging evaluation and/or prior mammograms for comparison. Electronically Signed   By: Edwin Cap M.D.   On: 04/27/2019 11:24          Pelvic/Bimanual Pap is not indicated today    Smoking History: Patient has never smoked and was not referred to quit line.    Patient Navigation: Patient education provided. Access to services provided for patient through [facility] program. No interpreter provided. No transportation provided   Colorectal Cancer Screening: Per patient has never had colonoscopy completed No complaints today. FIT test given.    Breast and Cervical Cancer Risk Assessment: Patient has family history of breast cancer, with her mother. Patient does not have history of cervical dysplasia, immunocompromised, or DES exposure in-utero.  Risk Assessment   No risk assessment data for the current encounter  Risk Scores       04/24/2019   Last edited by: Priscille Heidelberg, RN   5-year risk: 1.1 %   Lifetime risk: 11.3 %            A: [facility] exam without pap smear No complaints with benign exam.   P: Referred patient to the [facility] for a screening mammogram. Appointment scheduled 11/29/2022.  Ilda Basset A, NP 11/29/2022 2:06 PM

## 2022-12-03 ENCOUNTER — Telehealth: Payer: Self-pay | Admitting: *Deleted

## 2022-12-06 ENCOUNTER — Other Ambulatory Visit: Payer: Self-pay

## 2022-12-06 ENCOUNTER — Ambulatory Visit: Payer: Self-pay | Attending: Hematology and Oncology | Admitting: Hematology and Oncology

## 2022-12-06 DIAGNOSIS — Z124 Encounter for screening for malignant neoplasm of cervix: Secondary | ICD-10-CM

## 2022-12-06 NOTE — Progress Notes (Signed)
Patient: Katie Haynes           Date of Birth: Nov 01, 1975           MRN: [id number] Visit Date: 12/06/2022 PCP: Patient, No Pcp Per  Cervical Cancer Screening Do you smoke?: No Have you ever had or been told you have an allergy to latex products?: No Marital status: Single Date of last pap smear: 2-5 yrs ago (04/24/2019 Pap/HPV-Negative ([facility])) Date of last menstrual period: 11/27/22 Number of pregnancies: 0 Number of births: 0 Have you ever had any of the following? Hysterectomy: No Tubal ligation (tubes tied): No Abnormal bleeding: No Abnormal pap smear: Yes (10 years ago, prior to moving to [place], repeat pap only-negattive) Venereal warts: No (Hx of Herpes) A sex partner with venereal warts: No A high risk* sex partner: No  Cervical Exam Pap smear completed: Pap test Abnormal Observations: Normal exam.  Recommendations: Repeat in 5 years if normal and HPV-    Patient's History Patient Active Problem List   Diagnosis Date Noted  . Well woman exam with routine gynecological exam 04/24/2019   No past medical history on file.  Family History  Problem Relation Age of Onset  . Breast cancer Mother   . Osteoarthritis Mother   . Colon cancer Paternal Grandfather     Social History   Occupational History  . Not on file  Tobacco Use  . Smoking status: Never  . Smokeless tobacco: Never  Vaping Use  . Vaping Use: Never used  Substance and Sexual Activity  . Alcohol use: Yes    Comment: occassionally  . Drug use: Not Currently  . Sexual activity: Yes    Birth control/protection: None

## 2022-12-09 LAB — CYTOLOGY - PAP
Adequacy: ABSENT
Comment: NEGATIVE
Diagnosis: NEGATIVE
High risk HPV: NEGATIVE

## 2022-12-13 ENCOUNTER — Telehealth: Payer: Self-pay

## 2022-12-13 ENCOUNTER — Other Ambulatory Visit: Payer: Self-pay | Admitting: Hematology and Oncology

## 2022-12-13 MED ORDER — METRONIDAZOLE 500 MG PO TABS: 500.0000 mg | ORAL_TABLET | Freq: Two times a day (BID) | ORAL | 0 refills | Status: AC

## 2022-12-13 NOTE — Telephone Encounter (Signed)
Called patient to give pap smear results. Informed patient that pap smear was normal and HPV was negative. Based on this result her next pap smear will be due in 5 years. Patient voiced understanding. Explained to patient that her pap was suggestive for BV. Per patient's request she would like to be treated for BV. Flagyl Rx sent to [facility] in [place]. BID for 7 days, no alcohol while taking medication. Patient voiced understanding.

## 2023-01-06 ENCOUNTER — Ambulatory Visit: Payer: No Typology Code available for payment source
